# Patient Record
Sex: Male | Born: 1955 | Race: White | Hispanic: No | Marital: Married | Smoking: Current every day smoker
Health system: Southern US, Community
[De-identification: ages and names within clinical notes are randomized; demographics above are authoritative.]

## PROBLEM LIST (undated history)

## (undated) DIAGNOSIS — E7800 Pure hypercholesterolemia, unspecified: Secondary | ICD-10-CM

## (undated) HISTORY — PX: NEPHRECTOMY: SHX65

---

## 2021-03-24 ENCOUNTER — Encounter (HOSPITAL_COMMUNITY): Payer: Self-pay | Admitting: Emergency Medicine

## 2021-03-24 ENCOUNTER — Emergency Department (HOSPITAL_COMMUNITY)
Admission: EM | Admit: 2021-03-24 | Discharge: 2021-03-24 | Disposition: A | Discharge status: Home / Self Care | Payer: 59 | Attending: Emergency Medicine | Admitting: Emergency Medicine

## 2021-03-24 ENCOUNTER — Emergency Department (HOSPITAL_COMMUNITY): Payer: 59

## 2021-03-24 ENCOUNTER — Other Ambulatory Visit: Payer: Self-pay

## 2021-03-24 DIAGNOSIS — R072 Precordial pain: Secondary | ICD-10-CM

## 2021-03-24 DIAGNOSIS — F17210 Nicotine dependence, cigarettes, uncomplicated: Secondary | ICD-10-CM | POA: Insufficient documentation

## 2021-03-24 DIAGNOSIS — R079 Chest pain, unspecified: Secondary | ICD-10-CM | POA: Diagnosis present

## 2021-03-24 HISTORY — DX: Pure hypercholesterolemia, unspecified: E78.00

## 2021-03-24 LAB — TROPONIN I (HIGH SENSITIVITY)
Troponin I (High Sensitivity): 2 ng/L (ref <18)
Troponin I (High Sensitivity): 2 ng/L (ref <18)

## 2021-03-24 LAB — CBC
HCT: 44.3 % (ref 39.0–52.0)
Hemoglobin: 14.6 g/dL (ref 13.0–17.0)
MCH: 28.6 pg (ref 26.0–34.0)
MCHC: 33 g/dL (ref 30.0–36.0)
MCV: 86.9 fL (ref 80.0–100.0)
Platelets: 244 10*3/uL (ref 150–400)
RBC: 5.1 MIL/uL (ref 4.22–5.81)
RDW: 13.2 % (ref 11.5–15.5)
WBC: 5.8 10*3/uL (ref 4.0–10.5)
nRBC: 0 % (ref 0.0–0.2)

## 2021-03-24 LAB — HEPATIC FUNCTION PANEL
ALT: 11 U/L (ref 0–44)
AST: 12 U/L — ABNORMAL LOW (ref 15–41)
Albumin: 3.9 g/dL (ref 3.5–5.0)
Alkaline Phosphatase: 40 U/L (ref 38–126)
Bilirubin, Direct: 0.2 mg/dL (ref 0.0–0.2)
Indirect Bilirubin: 1.1 mg/dL — ABNORMAL HIGH (ref 0.3–0.9)
Total Bilirubin: 1.3 mg/dL — ABNORMAL HIGH (ref 0.3–1.2)
Total Protein: 6.4 g/dL — ABNORMAL LOW (ref 6.5–8.1)

## 2021-03-24 LAB — BASIC METABOLIC PANEL
Anion gap: 7 (ref 5–15)
BUN: 23 mg/dL (ref 8–23)
CO2: 24 mmol/L (ref 22–32)
Calcium: 8.6 mg/dL — ABNORMAL LOW (ref 8.9–10.3)
Chloride: 104 mmol/L (ref 98–111)
Creatinine, Ser: 1.36 mg/dL — ABNORMAL HIGH (ref 0.61–1.24)
GFR, Estimated: 58 mL/min — ABNORMAL LOW (ref >60)
Glucose, Bld: 119 mg/dL — ABNORMAL HIGH (ref 70–99)
Potassium: 3.9 mmol/L (ref 3.5–5.1)
Sodium: 135 mmol/L (ref 135–145)

## 2021-03-24 LAB — LIPASE, BLOOD: Lipase: 31 U/L (ref 11–51)

## 2021-03-24 LAB — D-DIMER, QUANTITATIVE: D-Dimer, Quant: <0.27 ug/mL-FEU (ref 0.00–0.50)

## 2021-03-24 MED ORDER — SODIUM CHLORIDE 0.9 % IV BOLUS
500.0000 mL | Freq: Once | INTRAVENOUS | Status: AC
  Administered 2021-03-24: 500 mL via INTRAVENOUS

## 2021-03-24 MED ORDER — PANTOPRAZOLE SODIUM 40 MG PO TBEC: 40.0000 mg | DELAYED_RELEASE_TABLET | Freq: Every day | ORAL | 0 refills | Status: AC

## 2021-03-24 MED ORDER — DICYCLOMINE HCL 20 MG PO TABS: 20.0000 mg | ORAL_TABLET | Freq: Three times a day (TID) | ORAL | 0 refills | Status: AC | PRN

## 2021-03-24 MED ORDER — MORPHINE SULFATE (PF) 4 MG/ML IV SOLN
4.0000 mg | Freq: Once | INTRAVENOUS | Status: AC
  Administered 2021-03-24: 4 mg via INTRAVENOUS
  Filled 2021-03-24: qty 1

## 2021-03-24 MED ORDER — ONDANSETRON HCL 4 MG/2ML IJ SOLN
4.0000 mg | Freq: Once | INTRAMUSCULAR | Status: AC
  Administered 2021-03-24: 4 mg via INTRAVENOUS
  Filled 2021-03-24: qty 2

## 2021-03-24 NOTE — ED Triage Notes (Signed)
Pt c/o left sided chest pain that radiates across his chest x 2 hours; pt took 324mg  asa prior to EMS arrival; was given 1 nitro tablet en route with no relief; denies n/v

## 2021-03-24 NOTE — ED Notes (Signed)
pt to Xray at this time

## 2021-03-24 NOTE — ED Provider Notes (Signed)
Emergency Department Provider Note   I have reviewed the triage vital signs and the nursing notes.   HISTORY  Chief Complaint Chest Pain   HPI Gary Norman is a 65 y.o. male with PMH of HLD and smoking presents to the emergency department for evaluation of chest pain which began this morning.  Patient describes a bandlike pain across his lower abdomen/upper chest.  He describes it as sharp.  He notices that taking a deep breath makes it hurt worse.  No significant pain increased with movement.  Denies any pain or swelling in the legs.  He states that he and his wife just got back from a 3-week road trip throughout the [place].  He states he mainly drove during the day but tried to take frequent breaks.  He denies any fevers, chills, cough.  He is not feeling particularly short of breath.  He is not becoming nauseated or having diaphoresis.  No diarrhea.  He took a full dose aspirin prior to EMS arrival.  EMS gave 1 nitroglycerin with no relief in symptoms.    Past Medical History:  Diagnosis Date  . Hypercholesteremia     There are no problems to display for this patient.   Past Surgical History:  Procedure Laterality Date  . NEPHRECTOMY Right     Allergies Patient has no known allergies.  No family history on file.  Social History Social History   Tobacco Use  . Smoking status: Current Every Day Smoker    Packs/day: 0.50    Types: Cigarettes  . Smokeless tobacco: Never Used  Substance Use Topics  . Alcohol use: Not Currently  . Drug use: Not Currently    Review of Systems  Constitutional: No fever/chills Eyes: No visual changes. ENT: No sore throat. Cardiovascular: Positive chest pain. Respiratory: Denies shortness of breath. Gastrointestinal: No abdominal pain.  No nausea, no vomiting.  No diarrhea.  No constipation. Genitourinary: Negative for dysuria. Musculoskeletal: Negative for back pain. Skin: Negative for rash. Neurological: Negative for  headaches, focal weakness or numbness.  10-point ROS otherwise negative.  ____________________________________________   PHYSICAL EXAM:  VITAL SIGNS: ED Triage Vitals  Enc Vitals Group     BP 03/24/21 0828 99/72     Pulse Rate 03/24/21 0828 (!) 55     Resp 03/24/21 0828 18     Temp 03/24/21 0828 98 F (36.7 C)     Temp Source 03/24/21 0828 Oral     SpO2 03/24/21 0828 100 %     Weight 03/24/21 0829 145 lb (65.8 kg)     Height 03/24/21 0829 5\' 7"  (1.702 m)   Constitutional: Alert and oriented. Well appearing and in no acute distress. Eyes: Conjunctivae are normal.  Head: Atraumatic. Nose: No congestion/rhinnorhea. Mouth/Throat: Mucous membranes are moist.  Neck: No stridor.   Cardiovascular: Normal rate, regular rhythm. Good peripheral circulation. Grossly normal heart sounds.   Respiratory: Normal respiratory effort.  No retractions. Lungs CTAB. Gastrointestinal: Soft and nontender. No distention.  Musculoskeletal: No lower extremity tenderness nor edema. No gross deformities of extremities. Neurologic:  Normal speech and language. No gross focal neurologic deficits are appreciated.  Skin:  Skin is warm, dry and intact. No rash noted.  ____________________________________________   LABS (all labs ordered are listed, but only abnormal results are displayed)  Labs Reviewed  BASIC METABOLIC PANEL - Abnormal; Notable for the following components:      Result Value   Glucose, Bld 119 (*)    Creatinine, Ser 1.36 (*)  Calcium 8.6 (*)    GFR, Estimated 58 (*)    All other components within normal limits  HEPATIC FUNCTION PANEL - Abnormal; Notable for the following components:   Total Protein 6.4 (*)    AST 12 (*)    Total Bilirubin 1.3 (*)    Indirect Bilirubin 1.1 (*)    All other components within normal limits  CBC  LIPASE, BLOOD  D-DIMER, QUANTITATIVE  TROPONIN I (HIGH SENSITIVITY)  TROPONIN I (HIGH SENSITIVITY)    ____________________________________________  EKG   EKG Interpretation  Date/Time:  Monday Mar 24 2021 08:27:04 EDT Ventricular Rate:  56 PR Interval:  141 QRS Duration: 96 QT Interval:  410 QTC Calculation: 396 R Axis:   73 Text Interpretation: Sinus rhythm ST elevation, consider inferior injury No previous ECGs available Confirmed by Alvira Monday ([id number]) on 03/25/2021 3:04:00 PM       ____________________________________________  RADIOLOGY  CXR reviewed.  ____________________________________________   PROCEDURES  Procedure(s) performed:   Procedures  None  ____________________________________________   INITIAL IMPRESSION / ASSESSMENT AND PLAN / ED COURSE  Pertinent labs & imaging results that were available during my care of the patient were reviewed by me and considered in my medical decision making (see chart for details).   Patient presents to the emergency department with chest pain across the lower chest somewhat pleuritic in nature.  Vital signs are within normal limits.  Mild bradycardia noted with normal oxygen saturation.  No fevers.  Patient's abdomen is diffusely soft and nontender.  Lower suspicion for pancreatitis, acute cholecystitis, gastritis.  Patient is at somewhat elevated risk for PE.  He does smoke and recently completed a 3-week road trip.  No clinical signs or symptoms to suspect DVT.  ACS is a consideration with several risk factors including age, high cholesterol, smoking.  EKG here shows nonspecific ST changes.  No STEMI. No old tracing for comparison.   Labs reviewed. D-dimer normal. Troponin is negative. Pain symptoms improved. Plan for discharge with PCP follow up.  ____________________________________________  FINAL CLINICAL IMPRESSION(S) / ED DIAGNOSES  Final diagnoses:  Precordial pain     MEDICATIONS GIVEN DURING THIS VISIT:  Medications  sodium chloride 0.9 % bolus 500 mL (0 mLs Intravenous Stopped 03/24/21 1127)   morphine 4 MG/ML injection 4 mg (4 mg Intravenous Given 03/24/21 0914)  ondansetron (ZOFRAN) injection 4 mg (4 mg Intravenous Given 03/24/21 0914)     NEW OUTPATIENT MEDICATIONS STARTED DURING THIS VISIT:  Discharge Medication List as of 03/24/2021 11:06 AM    START taking these medications   Details  dicyclomine (BENTYL) 20 MG tablet Take 1 tablet (20 mg total) by mouth 3 (three) times daily as needed for spasms., Starting Mon 03/24/2021, Print    pantoprazole (PROTONIX) 40 MG tablet Take 1 tablet (40 mg total) by mouth daily., Starting Mon 03/24/2021, Until Wed 04/23/2021, Print        Note:  This document was prepared using Dragon voice recognition software and may include unintentional dictation errors.  Alona Bene, MD, [facility] Emergency Medicine    Able Malloy, Arlyss Repress, MD 03/27/21 [phone]

## 2021-03-24 NOTE — Discharge Instructions (Signed)
You were seen in the emergency department today with chest discomfort.  Your lab work here is reassuring.  Your x-rays look normal.  I am prescribing 2 medications to help with symptoms.  If you develop worsening pain, shortness of breath, sweating, nausea you should return to this or the nearest emergency department for evaluation.   Please follow closely with your primary care doctor in the coming week.

## 2023-01-14 IMAGING — DX DG CHEST 2V
2 series · 2 of 2 positions shown · non-contrast
Comparison: None.

CLINICAL DATA: Pt c/o left sided chest pain that radiates across
his chest x 2 hours; pt took 324mg asa prior to EMS arrival; was
given 1 nitro tablet en route with no relief

EXAM:
CHEST - 2 VIEW

[chest pa]
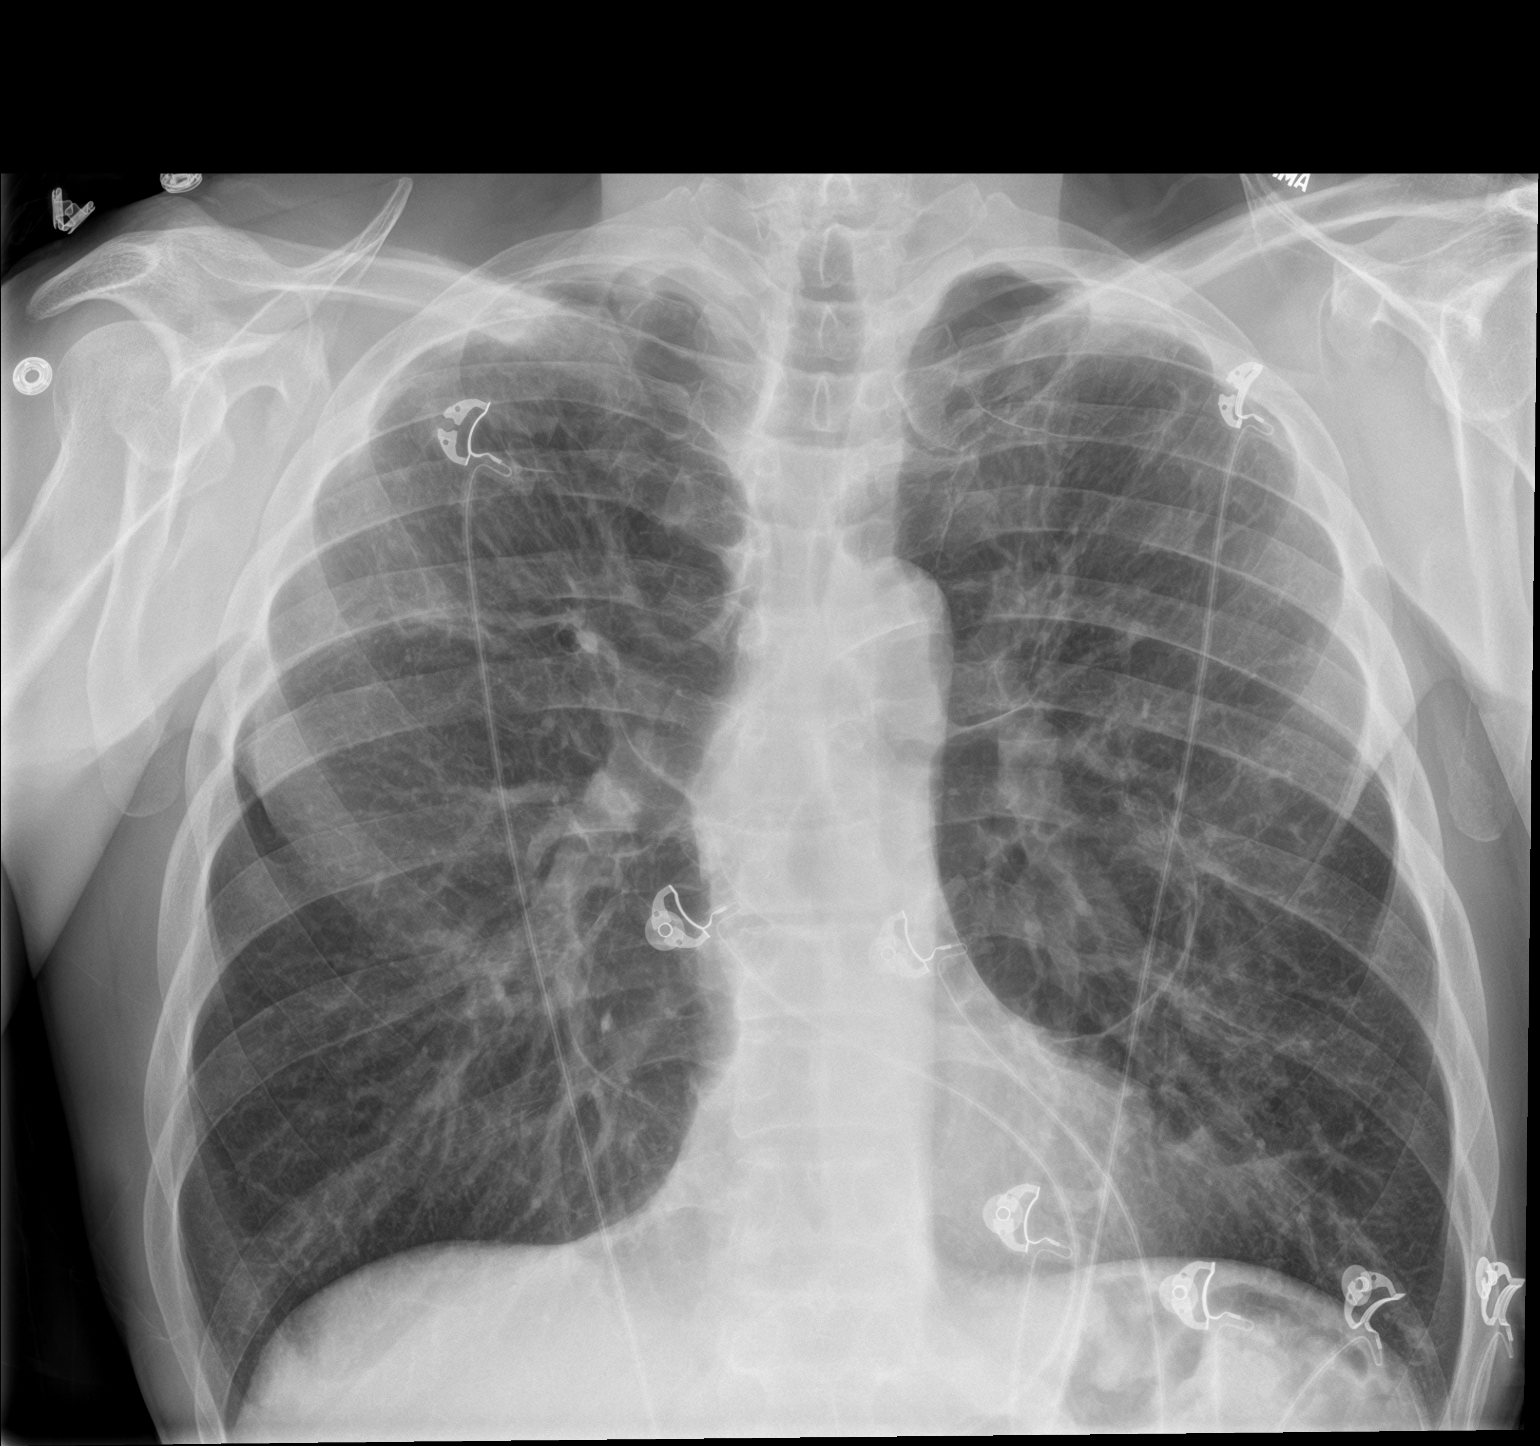

[chest lat]
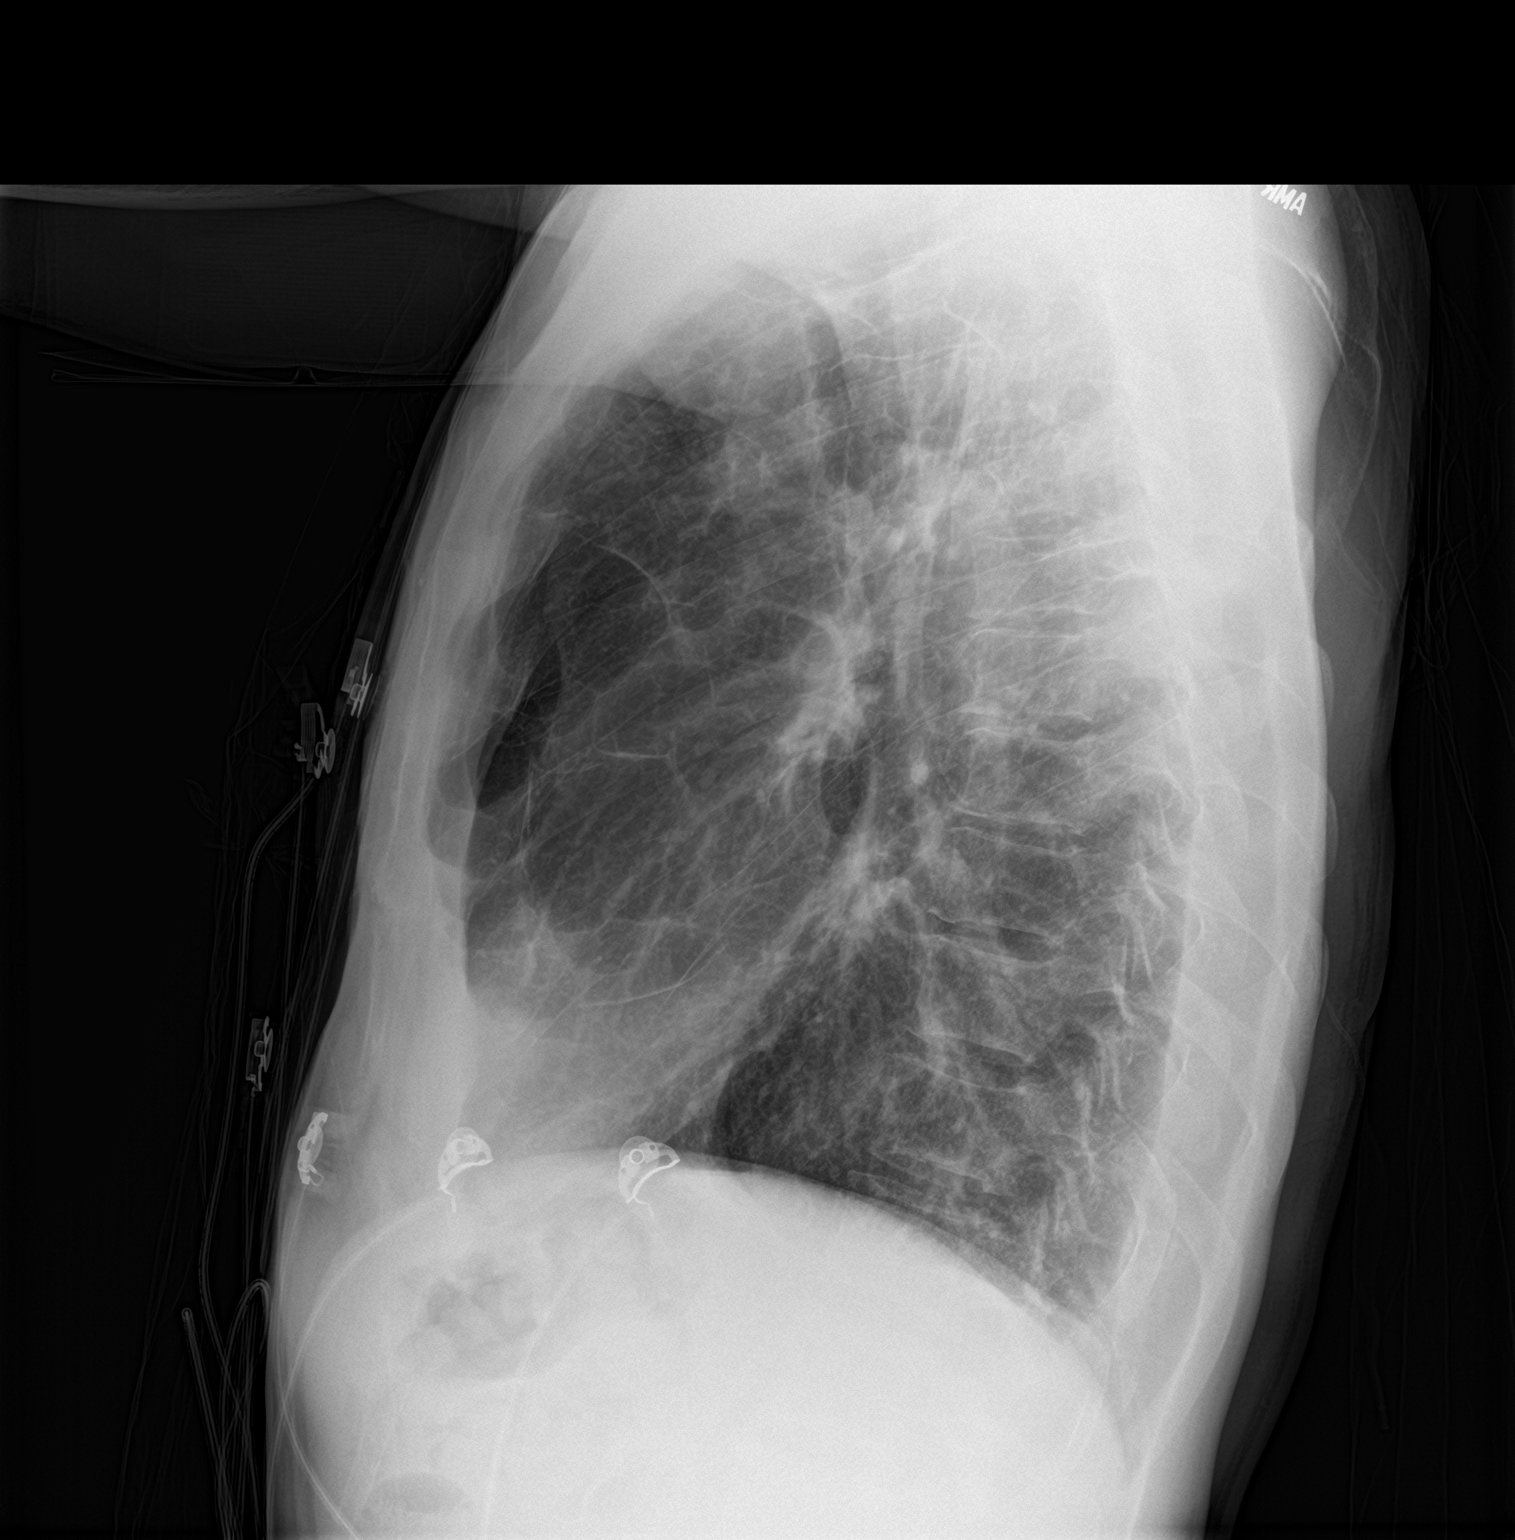

[2 of 2 positions shown; findings below may reference images not displayed]

FINDINGS: Small cardiac silhouette. Narrow mediastinum. No mediastinal or
hilar masses or adenopathy.

Lungs are hyperexpanded. There are changes consistent with bullous
emphysema. Linear scarring also noted most evident in the upper
lobes. No evidence of pneumonia or pulmonary edema.

No pleural effusion or pneumothorax.

Skeletal structures are intact.
IMPRESSION: 1. No acute cardiopulmonary disease.
2. Bullous emphysema and lung scarring.
